# Patient Record
Sex: Female | Born: 2000 | Race: Black or African American | Hispanic: No | Marital: Single | Smoking: Former smoker
Health system: Southern US, Community
[De-identification: ages and names within clinical notes are randomized; demographics above are authoritative.]

## PROBLEM LIST (undated history)

## (undated) DIAGNOSIS — L309 Dermatitis, unspecified: Secondary | ICD-10-CM

## (undated) DIAGNOSIS — J45909 Unspecified asthma, uncomplicated: Secondary | ICD-10-CM

## (undated) HISTORY — PX: NO PAST SURGERIES: SHX2092

## (undated) HISTORY — DX: Unspecified asthma, uncomplicated: J45.909

## (undated) HISTORY — DX: Dermatitis, unspecified: L30.9

---
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
## 2020-01-02 ENCOUNTER — Ambulatory Visit (INDEPENDENT_AMBULATORY_CARE_PROVIDER_SITE_OTHER): Payer: BC Managed Care – PPO

## 2020-01-02 DIAGNOSIS — J309 Allergic rhinitis, unspecified: Secondary | ICD-10-CM | POA: Diagnosis not present

## 2020-01-20 ENCOUNTER — Ambulatory Visit (INDEPENDENT_AMBULATORY_CARE_PROVIDER_SITE_OTHER): Payer: BC Managed Care – PPO | Admitting: *Deleted

## 2020-01-20 ENCOUNTER — Telehealth: Payer: Self-pay | Admitting: *Deleted

## 2020-01-20 DIAGNOSIS — J309 Allergic rhinitis, unspecified: Secondary | ICD-10-CM

## 2020-01-20 NOTE — Telephone Encounter (Signed)
Patient will be moving to [facility] to attend [employer] this fall. She inquired about transferring her vials to receive her injections in [place]. I gave her the forms to have completed to bring back in order for [place] to send her vials out, I instructed her to check with the student health center at [employer] to see if they will administer her injections. Patient verbalized understanding and will look into this and will bring form back signed if they are able, she said if not then she will just continue to get her injections at our office.

## 2020-01-23 NOTE — Telephone Encounter (Signed)
Forms have been signed and faxed back. Called to inform patient however mail box has not been set up yet. Will need to try again tomorrow 01/24/2020 see when she would like to pick them up.

## 2020-01-23 NOTE — Telephone Encounter (Signed)
Forms are in the blue folder.

## 2020-01-25 NOTE — Progress Notes (Unsigned)
Immunotherapy   Patient Details  Name: Jasmine Aguilar MRN: [id number] Date of Birth: 04-16-2001  01/25/2020  Sharen Hones Gatti is transferring vials to the student health center at [employer] for them to administer her allergy injections while she is in school. One vial with Pollen-G-W-T and the other with M-DM-C-D-CR with an expiration of 11/27/2020. Patient's mother came to pick vials up since patient is already in school and unable to make it to get them. Allergy injection guidelines sheet as well as the treatment record sheet has been filled out and sent with vials. Following schedule:  A Frequency: Once weekly Epi-Pen: Yes Consent signed and patient instructions given.   Dub Mikes 01/25/2020, 10:39 AM

## 2020-01-26 NOTE — Telephone Encounter (Signed)
Mom picked up vials on 01/25/2020 to take to patient since she is already in school and unable to pick them up.

## 2020-02-15 ENCOUNTER — Telehealth: Payer: Self-pay

## 2020-02-15 NOTE — Telephone Encounter (Signed)
RN for [facility] stated patient is 4 weeks behind her scheduled appt shots and need confirmation on what to do next. Patient is to be backed down to 0.1 of her blue vial and continue schedule dose per Dr. Dellis Anes. RN Azucena Fallen verbalized understanding.

## 2020-02-15 NOTE — Telephone Encounter (Signed)
Agree with the plan.  Valera Vallas, MD [facility]  

## 2020-06-12 ENCOUNTER — Telehealth: Payer: Self-pay

## 2020-06-12 NOTE — Telephone Encounter (Signed)
Patient receives injections at school. Her last known injection was 03/21/2020. Unsure of dose or vial at this time. She is going to call school then call us back. I did make sure that she knew the protocol for our injections and to make sure the school is recording her injections on the correct paperwork. Patient verbalized understanding and will call back with needed information to be sent to MD for dosage instructions.

## 2020-06-14 NOTE — Telephone Encounter (Signed)
Spoke with mom and she is going to have Aashritha give [place] a call back. I did call the patient but her voicemail is not set up yet.

## 2020-06-15 NOTE — Telephone Encounter (Signed)
When patient calls back she does need to schedule a follow up appointment and bring her injection log records along with her vials.

## 2020-06-15 NOTE — Telephone Encounter (Signed)
Patient called back and was informed again of the needed information. She is supposed to call the school and get what is needed.

## 2020-06-18 NOTE — Telephone Encounter (Signed)
Received fax from [employer] [facility] [employer] [facility] with patient's shot records. Her last injection was 05/01/2020 at 0.44mL of her Blue vial. I attempted to call the [facility] but since it is the new semester their phones were busy and I was on hold for almost 10 minutes. I re-faxed the records to their Center and advised that they should re-start the patient on her Blue vial at 0.58mL and continue to build up on schedule A. Records have been labeled and placed in bulk scanning.

## 2020-06-19 IMAGING — US ULTRASOUND RIGHT BREAST LIMITED
1 series · 13 of 17 positions shown · non-contrast
Comparison: Previous exam(s).

CLINICAL DATA: Follow-up probable fibroadenomas in the right
breast. The patient started birth control pills in June 2017
preceded by Nexplanon.

EXAM:
ULTRASOUND OF THE RIGHT BREAST

[Series 1: ultrasound right breast limited · 0.07mm/px · 13 of 17 slices shown]
[im 1/17]
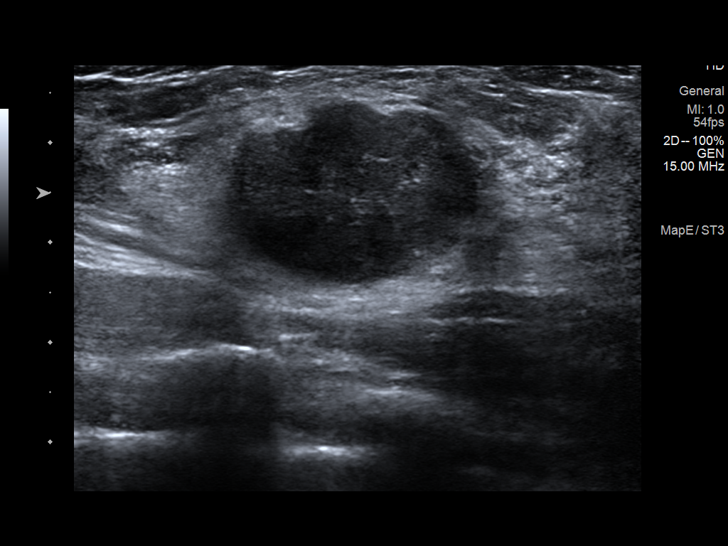
[im 2/17]
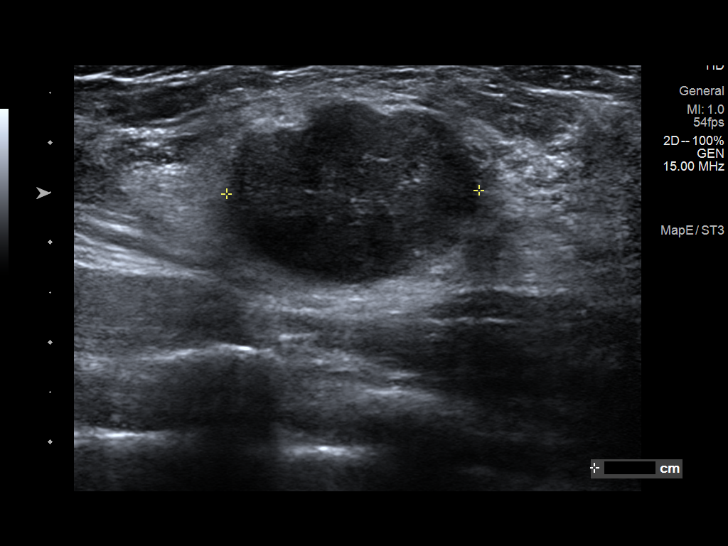
[im 4/17]
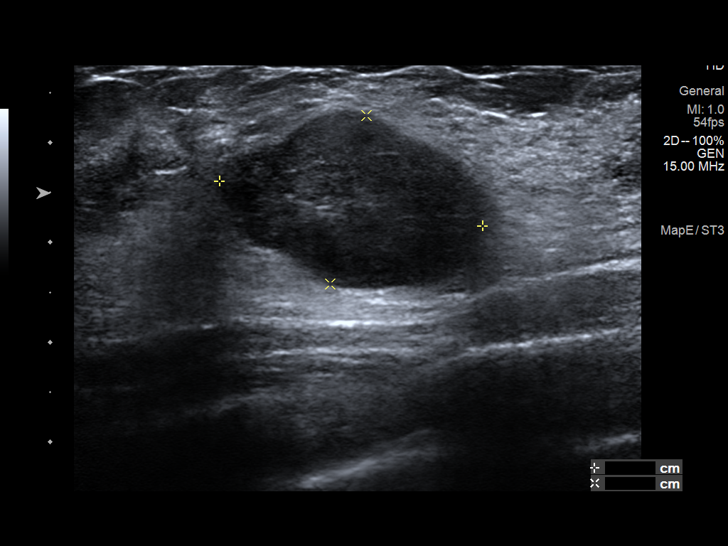
[im 5/17]
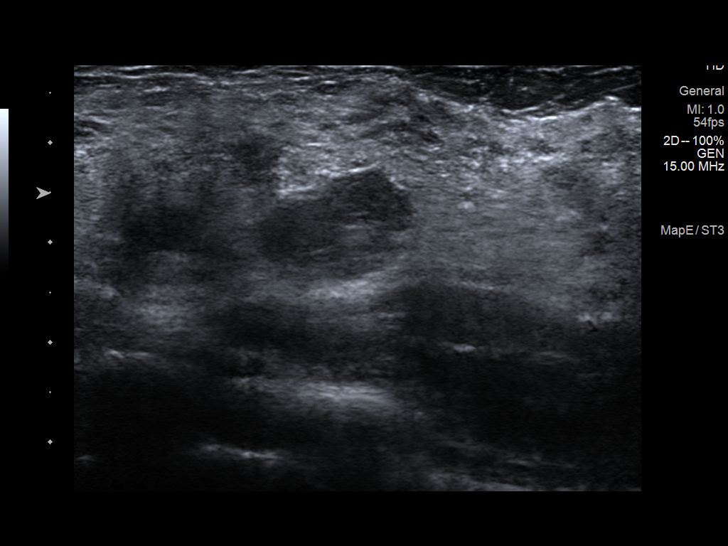
[im 6/17]
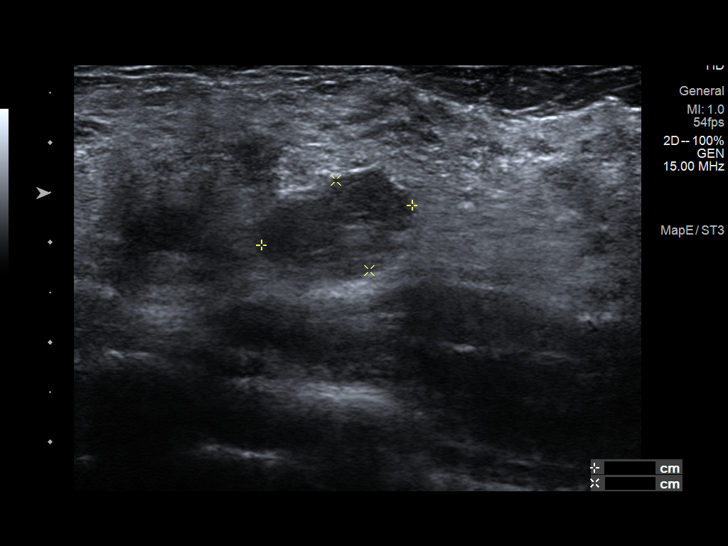
[im 8/17]
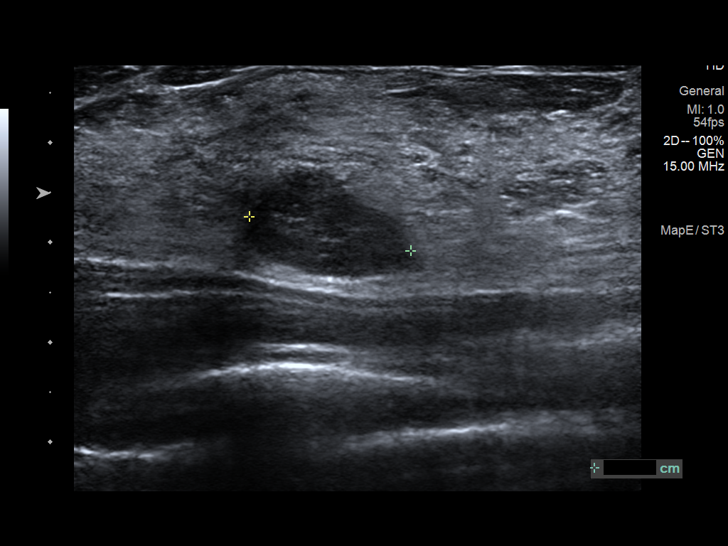
[im 9/17]
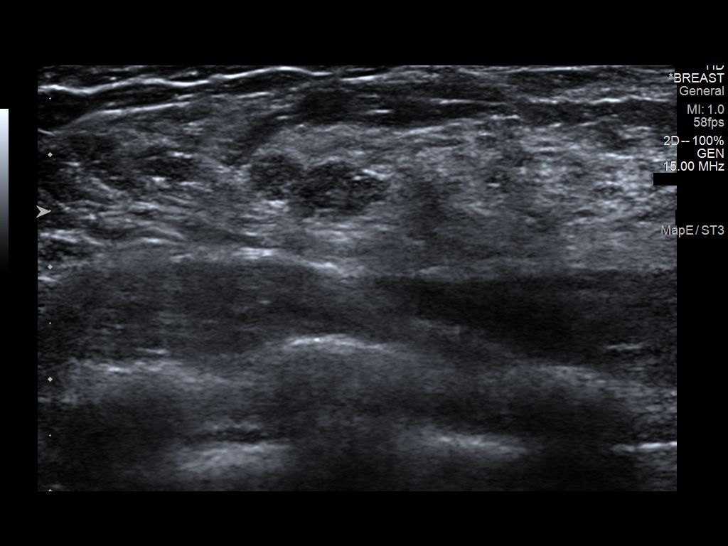
[im 10/17]
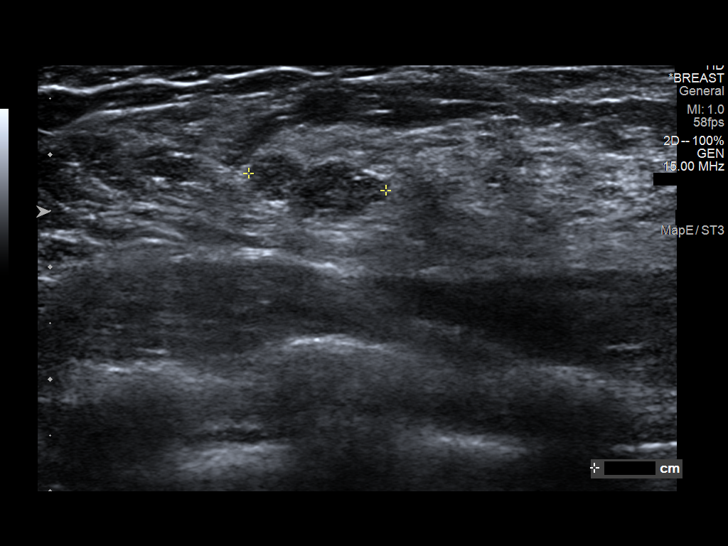
[im 12/17]
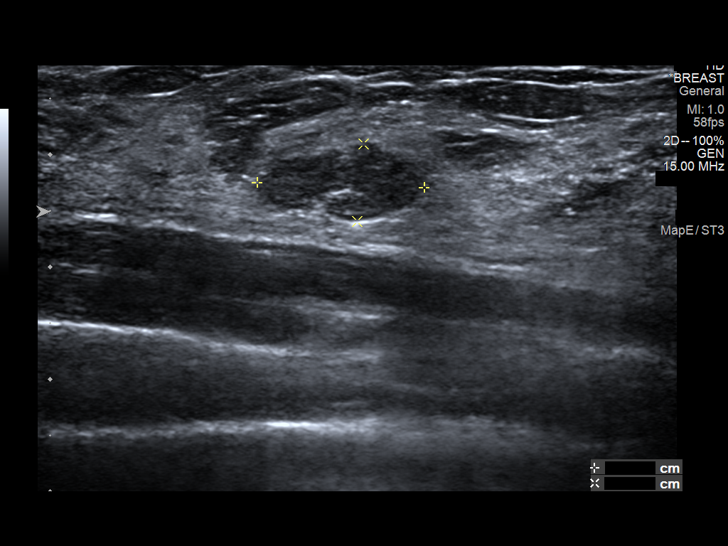
[im 13/17]
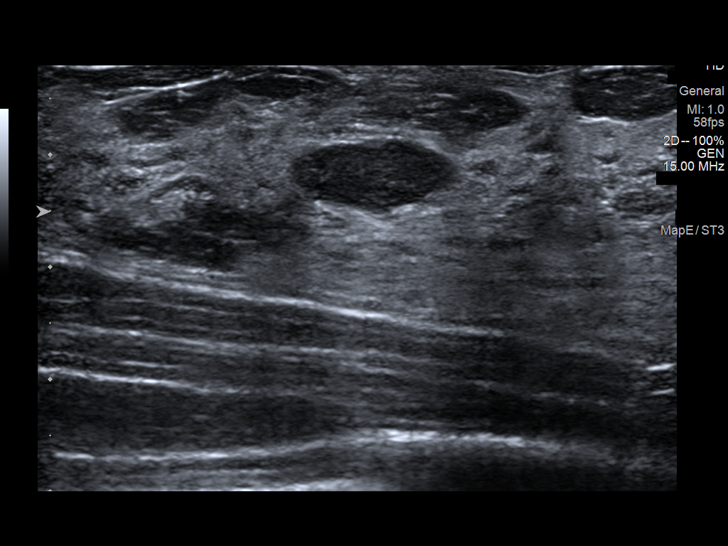
[im 14/17]
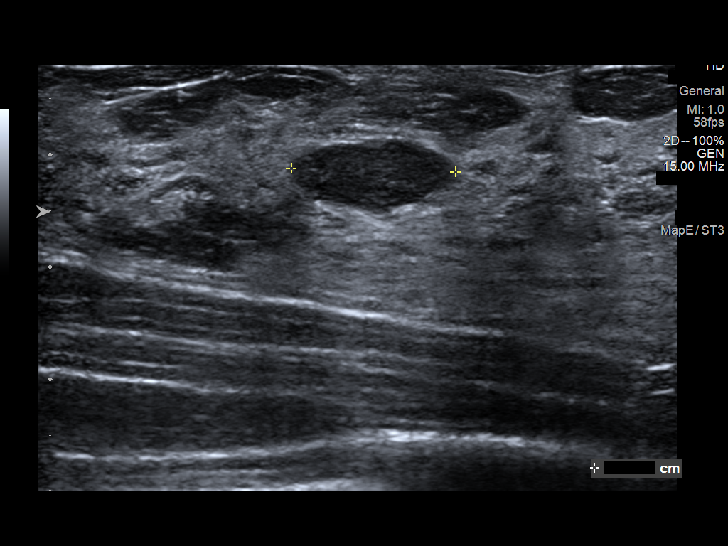
[im 16/17]
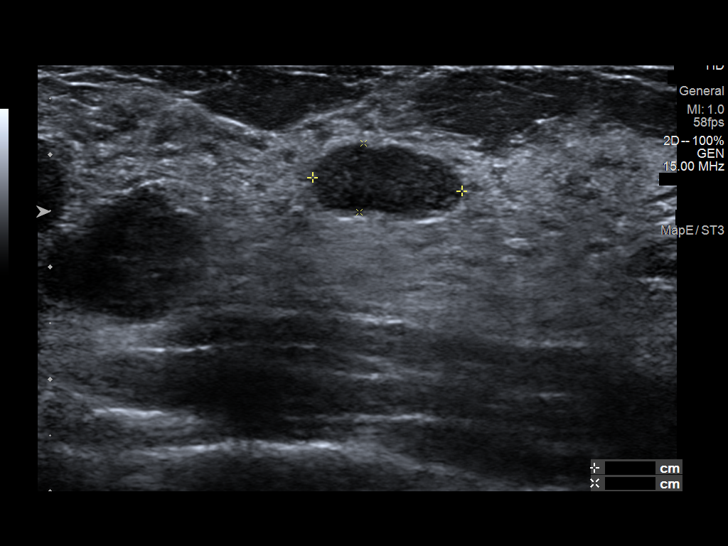
[im 17/17]
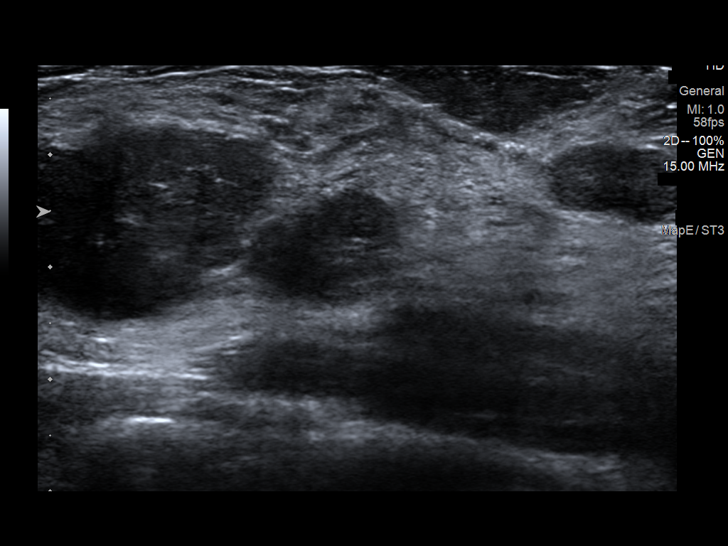

[13 of 17 positions shown; findings below may reference images not displayed]

FINDINGS: Targeted ultrasound is performed, showing four oval, circumscribed,
horizontally oriented, hypoechoic masses in the right breast as
follows:

2.7 x 2.5 x 1.7 cm in the 10 o'clock position, 6 cm from the nipple.
This measured 2.7 x 2.4 x 1.7 cm on 07/03/2017 and 2.7 x 2.4 x 1 5
cm on 12/31/2016.

1.7 x 1.6 x 1.0 cm in the 10:30 o'clock position, 6 cm from the
nipple. This measured 1.2 x 1.1 x 0.8 cm on 07/03/2017 and 1.0 x
x 0.6 cm on 12/31/2016.

1.5 x 1.2 x 0.7 cm in the 10:30 o'clock position, 6 cm from the
nipple. This measured 1.4 x 1.2 x 0.6 cm on 07/03/2017 and 1.3 x
x 0.5 cm on 12/31/2016.

1.5 x 1.3 x 0.6 cm in the 11 o'clock position, 6 cm from the nipple.
This measured 1.3 x 1.2 x 0.7 cm 07/03/2017, not seen on 12/31/2016.
IMPRESSION: Mild increase in size of the probable fibroadenoma in the 10:30
o'clock position of the right breast, 6 cm from the nipple. The
other 3 probable fibroadenomas have not changed significantly.

RECOMMENDATION:
Right breast ultrasound in 6 months.

I have discussed the findings and recommendations with the patient.
Results were also provided in writing at the conclusion of the
visit. If applicable, a reminder letter will be sent to the patient
regarding the next appointment.

BI-RADS CATEGORY  3: Probably benign.

## 2020-06-22 ENCOUNTER — Telehealth: Payer: Self-pay | Admitting: *Deleted

## 2020-06-22 NOTE — Telephone Encounter (Signed)
Patient called and stated that the [facility] didn't have her shot record of her last injection and did not receive the fax that I sent and that she needed to see the doctor to see what injection she needed next. I called and spoke with the nurse at the [facility] and she stated that the fax I sent over got cut off. She did have the most recent shot record and the noted last time that the patient received her injection. I advised to restart her Blue allergy vial at 0.67mL and to continue building up the patient on Schedule A. The nurse verbalized understanding and was fine with this. I called back the patient and advised. Patient verbalized understanding.

## 2020-07-18 NOTE — Telephone Encounter (Signed)
[facility] nurse [facility] called stating patient last received injection on 07/03/20. Advise nurse that our protocol states that we have to repeat last dose which is Blue allergy vial at 0.05 mL. Nurse Penn verbalized understanding.

## 2020-10-22 ENCOUNTER — Ambulatory Visit (INDEPENDENT_AMBULATORY_CARE_PROVIDER_SITE_OTHER): Payer: BC Managed Care – PPO | Admitting: *Deleted

## 2020-10-22 ENCOUNTER — Telehealth: Payer: Self-pay | Admitting: *Deleted

## 2020-10-22 DIAGNOSIS — J309 Allergic rhinitis, unspecified: Secondary | ICD-10-CM

## 2020-10-22 NOTE — Telephone Encounter (Signed)
Patient called and stated that she was picking up her allergy vials from her [employer] and asked if there were any forms she needed to fill out. I advised that she just needs to pick up her allergy vials and all of her injection records and bring them back to [place]. Patient verbalized understanding.

## 2020-10-23 DIAGNOSIS — J3089 Other allergic rhinitis: Secondary | ICD-10-CM | POA: Diagnosis not present

## 2020-10-23 NOTE — Progress Notes (Signed)
VIALS EXP 10-23-21 

## 2020-10-24 DIAGNOSIS — J301 Allergic rhinitis due to pollen: Secondary | ICD-10-CM | POA: Diagnosis not present

## 2020-10-26 ENCOUNTER — Ambulatory Visit: Payer: BC Managed Care – PPO | Admitting: Allergy

## 2020-10-26 ENCOUNTER — Other Ambulatory Visit: Payer: Self-pay

## 2020-10-26 ENCOUNTER — Encounter: Payer: Self-pay | Admitting: Allergy

## 2020-10-26 VITALS — BP 126/72 | HR 64 | Resp 16 | Ht 67.0 in | Wt 143.2 lb

## 2020-10-26 DIAGNOSIS — H1013 Acute atopic conjunctivitis, bilateral: Secondary | ICD-10-CM

## 2020-10-26 DIAGNOSIS — T781XXD Other adverse food reactions, not elsewhere classified, subsequent encounter: Secondary | ICD-10-CM

## 2020-10-26 DIAGNOSIS — J3089 Other allergic rhinitis: Secondary | ICD-10-CM | POA: Diagnosis not present

## 2020-10-26 DIAGNOSIS — J4520 Mild intermittent asthma, uncomplicated: Secondary | ICD-10-CM | POA: Diagnosis not present

## 2020-10-26 NOTE — Patient Instructions (Signed)
Mild intermittent asthma -- Well-controlled at this time -Inhalers:  Rescue: ProAir HFA puffs every 4 hours as needed for cough or wheeze.       -Use 2 puffs 10-20 minutes prior to exercise.  Asthma control goals:   Full participation in all desired activities (may need albuterol before activity)  Albuterol use two time or less a week on average (not counting use with activity)  Cough interfering with sleep two time or less a month  Oral steroids no more than once a year  No hospitalizations  Allergic rhinoconjunctivitis - Avoidance: Mite, Mold and Pollen - Antihistamine: Zyrtec 10mg  by mouth once daily for allergy symptom control   - Nasal Spray: Saline 2 spray(s) each nostril at bath time to keep nose/sinuses flushed and clean    If needed for nasal congestion Flonase 1-2 sprays each nostril daily.  Use 1-2 weeks at a time before stopping once symptoms improve.   - Continue allergen immunotherapy per schedule and have access to EpiPen.    Pollen food allergy syndrome  - Oral symptoms with banana injection in the past but is able to eat now but does not like to eat bananas in general    Follow up Visit: 12 months or sooner if needed.

## 2020-10-26 NOTE — Progress Notes (Signed)
Follow-up Note 0 RE: Jasmine Aguilar MRN: [id number] DOB: Oct 11, 2000 Date of Office Visit: 10/26/2020   History of present illness: Jasmine Aguilar is a 20 y.o. female presenting today for follow-up of asthma, allergic rhinitis with conjunctivitis with pollen food allergy syndrome.  She was last seen in the office on 11/24/2019 by myself.  She has done well since her last visit without any major health changes, surgeries or hospitalizations.  With her asthma she states she has not needed to use her albuterol since the last visit.  She denies any daytime or nighttime symptoms.  She has not had any ED or urgent care visits with systemic steroid needs. With her allergies she does have occasional nasal congestion and nasal itch and does use cetirizine daily.  This helps.  She states she will use her nasal spray Flonase as needed for the congestion which does help when used.  She is on immunotherapy in the buildup phase currently.  She is tolerating these well without any large local or systemic reactions.  She has been receiving her injections during the school year at her student health at [employer].  She does not really eat bananas.     Review of systems: Review of Systems  Constitutional: Negative.   HENT: Positive for congestion.   Eyes: Negative.   Respiratory: Negative.   Cardiovascular: Negative.   Gastrointestinal: Negative.   Musculoskeletal: Negative.   Skin: Negative.   Neurological: Negative.     All other systems negative unless noted above in HPI  Past medical/social/surgical/family history have been reviewed and are unchanged unless specifically indicated below.  Rising sophomore in college  Medication List: Current Outpatient Medications  Medication Sig Dispense Refill  . cetirizine (ZYRTEC) 10 MG tablet Take 1 tablet (10 mg total) by mouth daily. 30 tablet 5  . EPINEPHrine (EPIPEN 2-PAK) 0.3 mg/0.3 mL IJ SOAJ injection Inject 0.3 mLs (0.3 mg total) into  the muscle as needed for anaphylaxis. 2 each 1  . famotidine (PEPCID) 20 MG tablet Take 1 tablet (20 mg total) by mouth daily. 30 tablet 3  . fluticasone (FLONASE) 50 MCG/ACT nasal spray Place 2 sprays into both nostrils daily. 48 g 1  . PROAIR HFA 108 (90 Base) MCG/ACT inhaler INHALE 2 PUFFS INTO LUNGS EVERY 4 HOURS AS NEEDED FOR WHEEZING OR SHORTNESS OF BREATH 8.5 g 1  . norethindrone-ethinyl estradiol (LOESTRIN) 1-20 MG-MCG tablet TK 1 T PO QD (Patient not taking: No sig reported)     No current facility-administered medications for this visit.     Known medication allergies: No Known Allergies   Physical examination: Blood pressure 126/72, pulse 64, resp. rate 16, height 5\' 7"  (1.702 m), weight 143 lb 3.2 oz (65 kg), SpO2 100 %.  General: Alert, interactive, in no acute distress. HEENT: PERRLA, TMs pearly gray, turbinates mildly edematous without discharge, post-pharynx non erythematous. Neck: Supple without lymphadenopathy. Lungs: Clear to auscultation without wheezing, rhonchi or rales. {no increased work of breathing. CV: Normal S1, S2 without murmurs. Abdomen: Nondistended, nontender. Skin: Warm and dry, without lesions or rashes. Extremities:  No clubbing, cyanosis or edema. Neuro:   Grossly intact.  Diagnositics/Labs: None today  Assessment and plan:   Mild intermittent asthma -- Well-controlled at this time -Inhalers:  Rescue: ProAir HFA puffs every 4 hours as needed for cough or wheeze.       -Use 2 puffs 10-20 minutes prior to exercise.  Asthma control goals:   Full participation in all desired  activities (may need albuterol before activity)  Albuterol use two time or less a week on average (not counting use with activity)  Cough interfering with sleep two time or less a month  Oral steroids no more than once a year  No hospitalizations  Allergic rhinitis with conjunctivitis - Avoidance: Mite, Mold and Pollen - Antihistamine: Zyrtec 10mg  by mouth once  daily for allergy symptom control   - Nasal Spray: Saline 2 spray(s) each nostril at bath time to keep nose/sinuses flushed and clean    If needed for nasal congestion Flonase 1-2 sprays each nostril daily.  Use 1-2 weeks at a time before stopping once symptoms improve.   - Continue allergen immunotherapy per schedule and have access to EpiPen.    Pollen food allergy syndrome  - Oral symptoms with banana injection in the past but is able to eat now but does not like to eat bananas in general    Follow up Visit: 12 months or sooner if needed.    I appreciate the opportunity to take part in Omie's care. Please do not hesitate to contact me with questions.  Sincerely,   , MD Allergy/Immunology [facility]

## 2020-10-30 ENCOUNTER — Ambulatory Visit (INDEPENDENT_AMBULATORY_CARE_PROVIDER_SITE_OTHER): Payer: BC Managed Care – PPO | Admitting: *Deleted

## 2020-10-30 DIAGNOSIS — J309 Allergic rhinitis, unspecified: Secondary | ICD-10-CM | POA: Diagnosis not present

## 2020-11-27 ENCOUNTER — Ambulatory Visit (INDEPENDENT_AMBULATORY_CARE_PROVIDER_SITE_OTHER): Payer: BC Managed Care – PPO | Admitting: *Deleted

## 2020-11-27 DIAGNOSIS — J309 Allergic rhinitis, unspecified: Secondary | ICD-10-CM

## 2020-12-03 ENCOUNTER — Ambulatory Visit (INDEPENDENT_AMBULATORY_CARE_PROVIDER_SITE_OTHER): Payer: BC Managed Care – PPO

## 2020-12-03 DIAGNOSIS — J309 Allergic rhinitis, unspecified: Secondary | ICD-10-CM

## 2020-12-06 ENCOUNTER — Emergency Department (HOSPITAL_COMMUNITY)
Admission: EM | Admit: 2020-12-06 | Discharge: 2020-12-06 | Disposition: A | Discharge status: Home / Self Care | Payer: BC Managed Care – PPO | Attending: Emergency Medicine | Admitting: Emergency Medicine

## 2020-12-06 ENCOUNTER — Other Ambulatory Visit: Payer: Self-pay

## 2020-12-06 DIAGNOSIS — F1910 Other psychoactive substance abuse, uncomplicated: Secondary | ICD-10-CM | POA: Insufficient documentation

## 2020-12-06 DIAGNOSIS — F1990 Other psychoactive substance use, unspecified, uncomplicated: Secondary | ICD-10-CM

## 2020-12-06 DIAGNOSIS — R1084 Generalized abdominal pain: Secondary | ICD-10-CM

## 2020-12-06 DIAGNOSIS — R110 Nausea: Secondary | ICD-10-CM | POA: Diagnosis not present

## 2020-12-06 DIAGNOSIS — J45909 Unspecified asthma, uncomplicated: Secondary | ICD-10-CM | POA: Insufficient documentation

## 2020-12-06 LAB — I-STAT CHEM 8, ED
BUN: 12 mg/dL (ref 6–20)
Calcium, Ion: 1.28 mmol/L (ref 1.15–1.40)
Chloride: 99 mmol/L (ref 98–111)
Creatinine, Ser: 0.9 mg/dL (ref 0.44–1.00)
Glucose, Bld: 101 mg/dL — ABNORMAL HIGH (ref 70–99)
HCT: 42 % (ref 36.0–46.0)
Hemoglobin: 14.3 g/dL (ref 12.0–15.0)
Potassium: 3.8 mmol/L (ref 3.5–5.1)
Sodium: 137 mmol/L (ref 135–145)
TCO2: 28 mmol/L (ref 22–32)

## 2020-12-06 LAB — I-STAT BETA HCG BLOOD, ED (MC, WL, AP ONLY): I-stat hCG, quantitative: <5 m[IU]/mL (ref <5)

## 2020-12-06 MED ORDER — FAMOTIDINE IN NACL 20-0.9 MG/50ML-% IV SOLN
20.0000 mg | Freq: Once | INTRAVENOUS | Status: AC
  Administered 2020-12-06: 20 mg via INTRAVENOUS
  Filled 2020-12-06: qty 50

## 2020-12-06 MED ORDER — ONDANSETRON HCL 4 MG/2ML IJ SOLN
4.0000 mg | Freq: Once | INTRAMUSCULAR | Status: AC
  Administered 2020-12-06: 4 mg via INTRAVENOUS
  Filled 2020-12-06: qty 2

## 2020-12-06 MED ORDER — DICYCLOMINE HCL 10 MG/ML IM SOLN
20.0000 mg | Freq: Once | INTRAMUSCULAR | Status: DC
  Filled 2020-12-06: qty 2

## 2020-12-06 MED ORDER — SODIUM CHLORIDE 0.9 % IV BOLUS
1000.0000 mL | Freq: Once | INTRAVENOUS | Status: AC
  Administered 2020-12-06: 1000 mL via INTRAVENOUS

## 2020-12-06 NOTE — Discharge Instructions (Addendum)
Your symptoms were likely due to the drugs used earlier today, they can commonly cause anxiety or stomach upset.  Avoid using these in the future.  Follow-up with your primary care doctor as needed.

## 2020-12-06 NOTE — ED Triage Notes (Signed)
Pt BIB EMS from home. At approx 1400 today pt ate mushrooms and smoked marijuana, started feeling poorly afterwards. Pt A&Ox4, pt ambulatory with EMS upon arrival.   116/82 90 HR 99% RA

## 2020-12-06 NOTE — ED Notes (Signed)
An After Visit Summary was printed and given to the patient. Discharge instructions given and no further questions at this time.  Pt A&Ox4. Pt ambulatory.

## 2020-12-06 NOTE — ED Provider Notes (Signed)
[facility]-EMERGENCY DEPT Provider Note   CSN: [id number] Arrival date & time: 12/06/20  1813     History Chief Complaint  Patient presents with   Ingestion    Jasmine Aguilar is a 20 y.o. female.  Jasmine Aguilar is a 20 y.o. female with a history of asthma and eczema, who presents to the ED via EMS for evaluation of feeling poorly and some generalized abdominal pain.  Patient reports around 2:00 this afternoon she ate mushrooms and smoked marijuana later in the afternoon did additional mushrooms and started feeling very poorly.  She reports "I just do not feel right in my body".  She reports generalized abdominal pain and some nausea but no vomiting.  No chest pain or shortness of breath.  Patient also reports feeling anxious.  Patient reports she does have prior history of some GI issues and is followed by [facility], but has recently been doing better with this.  Patient very concerned and wondering what could be causing this abdominal pain.  No other aggravating or alleviating factors.  The history is provided by the patient.      Past Medical History:  Diagnosis Date   Asthma    Eczema     There are no problems to display for this patient.   Past Surgical History:  Procedure Laterality Date   NO PAST SURGERIES       OB History   No obstetric history on file.     Family History  Problem Relation Age of Onset   Bronchitis Father    Eczema Brother    Allergic rhinitis Neg Hx    Angioedema Neg Hx    Asthma Neg Hx    Atopy Neg Hx    Immunodeficiency Neg Hx    Urticaria Neg Hx     Social History   Tobacco Use   Smoking status: Never   Smokeless tobacco: Never  Vaping Use   Vaping Use: Some days  Substance Use Topics   Alcohol use: No    Alcohol/week: 0.0 standard drinks   Drug use: No    Home Medications Prior to Admission medications   Medication Sig Start Date End Date Taking? Authorizing Provider  cetirizine (ZYRTEC) 10 MG  tablet Take 1 tablet (10 mg total) by mouth daily. 11/24/19   Marcelyn Bruins, MD  EPINEPHrine (EPIPEN 2-PAK) 0.3 mg/0.3 mL IJ SOAJ injection Inject 0.3 mLs (0.3 mg total) into the muscle as needed for anaphylaxis. 11/24/19   Marcelyn Bruins, MD  famotidine (PEPCID) 20 MG tablet Take 1 tablet (20 mg total) by mouth daily. 11/24/19   Marcelyn Bruins, MD  fluticasone (FLONASE) 50 MCG/ACT nasal spray Place 2 sprays into both nostrils daily. 11/24/19   Marcelyn Bruins, MD  norethindrone-ethinyl estradiol (LOESTRIN) 1-20 MG-MCG tablet TK 1 T PO QD Patient not taking: No sig reported 08/15/18   [provider]  PROAIR HFA 108 (90 Base) MCG/ACT inhaler INHALE 2 PUFFS INTO LUNGS EVERY 4 HOURS AS NEEDED FOR WHEEZING OR SHORTNESS OF BREATH 11/24/18   Marcelyn Bruins, MD    Allergies    Patient has no known allergies.  Review of Systems   Review of Systems  Constitutional:  Negative for chills and fever.  Respiratory:  Negative for cough and shortness of breath.   Cardiovascular:  Negative for chest pain.  Gastrointestinal:  Positive for abdominal pain and nausea. Negative for constipation, diarrhea and vomiting.  Psychiatric/Behavioral:  The patient is  nervous/anxious.   All other systems reviewed and are negative.  Physical Exam Updated Vital Signs BP (!) 143/92 (BP Location: Left Arm)   Pulse 92   Temp 98.8 F (37.1 C) (Oral)   Resp 20   SpO2 99%   Physical Exam Vitals and nursing note reviewed.  Constitutional:      General: She is not in acute distress.    Appearance: Normal appearance. She is well-developed. She is not ill-appearing or diaphoretic.     Comments: Patient appears anxious, alert and in no acute distress.  HENT:     Head: Normocephalic and atraumatic.  Eyes:     General:        Right eye: No discharge.        Left eye: No discharge.  Cardiovascular:     Rate and Rhythm: Normal rate and regular rhythm.     Heart  sounds: Normal heart sounds.  Pulmonary:     Effort: Pulmonary effort is normal. No respiratory distress.  Abdominal:     General: Bowel sounds are normal. There is no distension.     Palpations: Abdomen is soft. There is no mass.     Tenderness: There is abdominal tenderness. There is no guarding.     Comments: Abdomen is soft, nondistended, bowel sounds present throughout, very mild epigastric tenderness, all other quadrants nontender.  Musculoskeletal:        General: No deformity.  Skin:    General: Skin is warm and dry.  Neurological:     Mental Status: She is alert and oriented to person, place, and time.     Coordination: Coordination normal.  Psychiatric:        Mood and Affect: Mood is anxious. Affect is labile.        Behavior: Behavior normal.    ED Results / Procedures / Treatments   Labs (all labs ordered are listed, but only abnormal results are displayed) Labs Reviewed  I-STAT CHEM 8, ED - Abnormal; Notable for the following components:      Result Value   Glucose, Bld 101 (*)    All other components within normal limits  I-STAT BETA HCG BLOOD, ED ([facility], [facility], [facility] ONLY)    EKG None  Radiology No results found.  Procedures Procedures   Medications Ordered in ED Medications  dicyclomine (BENTYL) injection 20 mg (20 mg Intramuscular Patient Refused/Not Given 12/06/20 1859)  sodium chloride 0.9 % bolus 1,000 mL (0 mLs Intravenous Stopped 12/06/20 1945)  ondansetron (ZOFRAN) injection 4 mg (4 mg Intravenous Given 12/06/20 1856)  famotidine (PEPCID) IVPB 20 mg premix (0 mg Intravenous Stopped 12/06/20 1945)    ED Course  I have reviewed the triage vital signs and the nursing notes.  Pertinent labs & imaging results that were available during my care of the patient were reviewed by me and considered in my medical decision making (see chart for details).    MDM Rules/Calculators/A&P                         20 year old female who is abdominal pain after using  mushrooms and marijuana earlier this afternoon.  On arrival patient is still high but is alert, and anxious.  Has very mild epigastric tenderness.  Will check i-STAT Chem-8 and pregnancy test and give IV fluids, Zofran and Pepcid.  Suspect symptoms are related to drug use, extremely low suspicion for more serious acute intra-abdominal pathology given reassuring exam.  Lab work is unremarkable.  Symptoms are improved after fluids and medications here in the ED and patient is feeling much better, asking to go home.   At this time there does not appear to be any evidence of an acute emergency medical condition and the patient appears stable for discharge with appropriate outpatient follow up.Diagnosis was discussed with patient who verbalizes understanding and is agreeable to discharge.  Final Clinical Impression(s) / ED Diagnoses Final diagnoses:  Generalized abdominal pain  Nausea  Drug use    Rx / DC Orders ED Discharge Orders     None        Legrand Rams 12/06/20 2015    Terrilee Files, MD 12/07/20 1428

## 2020-12-11 ENCOUNTER — Ambulatory Visit (INDEPENDENT_AMBULATORY_CARE_PROVIDER_SITE_OTHER): Payer: BC Managed Care – PPO | Admitting: *Deleted

## 2020-12-11 DIAGNOSIS — J309 Allergic rhinitis, unspecified: Secondary | ICD-10-CM

## 2020-12-17 ENCOUNTER — Ambulatory Visit (INDEPENDENT_AMBULATORY_CARE_PROVIDER_SITE_OTHER): Payer: BC Managed Care – PPO

## 2020-12-17 DIAGNOSIS — J309 Allergic rhinitis, unspecified: Secondary | ICD-10-CM | POA: Diagnosis not present

## 2020-12-31 ENCOUNTER — Ambulatory Visit (INDEPENDENT_AMBULATORY_CARE_PROVIDER_SITE_OTHER): Payer: BC Managed Care – PPO

## 2020-12-31 DIAGNOSIS — J309 Allergic rhinitis, unspecified: Secondary | ICD-10-CM

## 2021-01-14 ENCOUNTER — Ambulatory Visit (INDEPENDENT_AMBULATORY_CARE_PROVIDER_SITE_OTHER): Payer: BC Managed Care – PPO | Admitting: *Deleted

## 2021-01-14 DIAGNOSIS — J309 Allergic rhinitis, unspecified: Secondary | ICD-10-CM

## 2021-01-22 ENCOUNTER — Ambulatory Visit (INDEPENDENT_AMBULATORY_CARE_PROVIDER_SITE_OTHER): Payer: BC Managed Care – PPO | Admitting: *Deleted

## 2021-01-22 DIAGNOSIS — J309 Allergic rhinitis, unspecified: Secondary | ICD-10-CM

## 2021-03-14 ENCOUNTER — Telehealth: Payer: Self-pay | Admitting: *Deleted

## 2021-03-14 NOTE — Telephone Encounter (Signed)
Received fax from [facility] requesting patients Gold vials. She reached .40mL of her Blue Vials, I called and spoke with the injection nurse and she stated that there was plenty of serum left for her to reach .51mL of her Blue vials, but that they would be needing the Gold vials soon. I advised that we will mail them out this coming Monday to [facility]. [employer], [address], [address]. [address], [place], [place] [address]. Phone number [phone] Fax [phone]. Patient has been scheduled for her Gold vials to be mailed out this coming Monday. Called patient and informed and advised not to cancel the appointment, patient verbalized understanding. Shot records have been labeled and placed in bulk scanning.

## 2021-03-18 ENCOUNTER — Ambulatory Visit: Payer: BC Managed Care – PPO

## 2021-03-18 ENCOUNTER — Other Ambulatory Visit: Payer: Self-pay

## 2021-03-18 DIAGNOSIS — J309 Allergic rhinitis, unspecified: Secondary | ICD-10-CM

## 2021-03-18 NOTE — Progress Notes (Signed)
Immunotherapy   Patient Details  Name: Jasmine Aguilar MRN: [id number] Date of Birth: 2000/06/26  03/18/2021  Sharen Hones Whittlesey mail out gold vials to [facility] [employer], [address], [address]. [address] [place], [place] [address]. Phone number [phone] Fax [phone] per telephone encounter. One gold vial with Pollen: G-W-T and the other with M-DM-C-D-CR with an expiration of 10/23/2021.  Following schedule: A  Frequency:1 time per week Epi-Pen:Epi-Pen Available  Consent signed and patient instructions given.   Dub Mikes 03/18/2021, 8:53 AM

## 2021-05-22 ENCOUNTER — Ambulatory Visit (INDEPENDENT_AMBULATORY_CARE_PROVIDER_SITE_OTHER): Payer: BC Managed Care – PPO

## 2021-05-22 DIAGNOSIS — J309 Allergic rhinitis, unspecified: Secondary | ICD-10-CM | POA: Diagnosis not present

## 2021-06-11 ENCOUNTER — Ambulatory Visit (INDEPENDENT_AMBULATORY_CARE_PROVIDER_SITE_OTHER): Payer: BC Managed Care – PPO | Admitting: *Deleted

## 2021-06-11 DIAGNOSIS — J309 Allergic rhinitis, unspecified: Secondary | ICD-10-CM

## 2021-06-21 ENCOUNTER — Ambulatory Visit (INDEPENDENT_AMBULATORY_CARE_PROVIDER_SITE_OTHER): Payer: BC Managed Care – PPO

## 2021-06-21 DIAGNOSIS — J309 Allergic rhinitis, unspecified: Secondary | ICD-10-CM | POA: Diagnosis not present

## 2021-06-21 NOTE — Progress Notes (Signed)
Immunotherapy   Patient Details  Name: Jasmine Aguilar MRN: [id number] Date of Birth: December 06, 2000  06/21/2021  [name] is here to receive injection and pick up vials to take to college for M-DM-C-D-CR and Pollen-G-W-T Following schedule: A  Frequency:1 time per week Epi-Pen:Epi-Pen Available  Paperwork provided and  patient instructions given.   Guy Franco 06/21/2021, 3:50 PM

## 2021-06-25 IMAGING — US US BREAST*R* LIMITED INC AXILLA
1 series · 13 of 16 positions shown · non-contrast
Comparison: Previous exam(s).

CLINICAL DATA: Follow-up of probably benign right breast masses,
first imaged in December 2016.

EXAM:
ULTRASOUND OF THE RIGHT BREAST

[Series 1: us breast*right* limited inc axilla · 0.07mm/px · 13 of 16 slices shown]
[im 1/16]
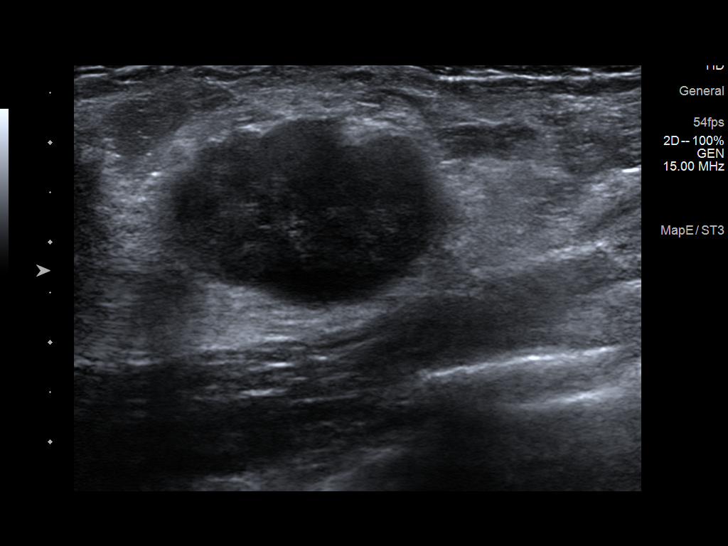
[im 2/16]
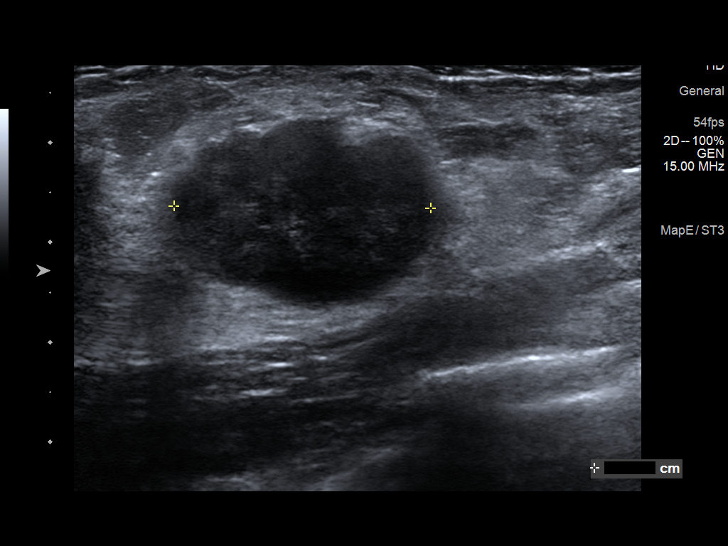
[im 4/16]
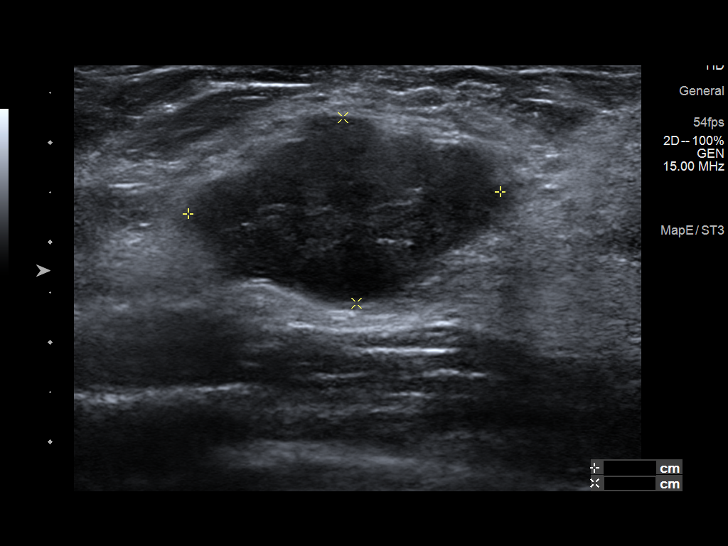
[im 5/16]
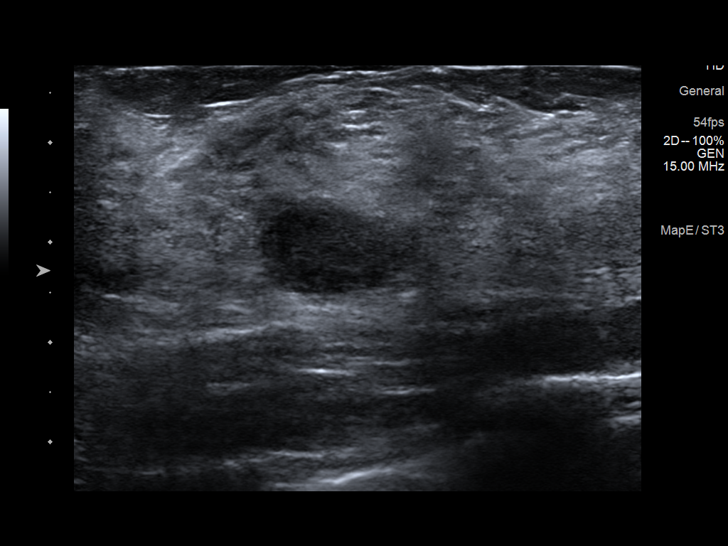
[im 6/16]
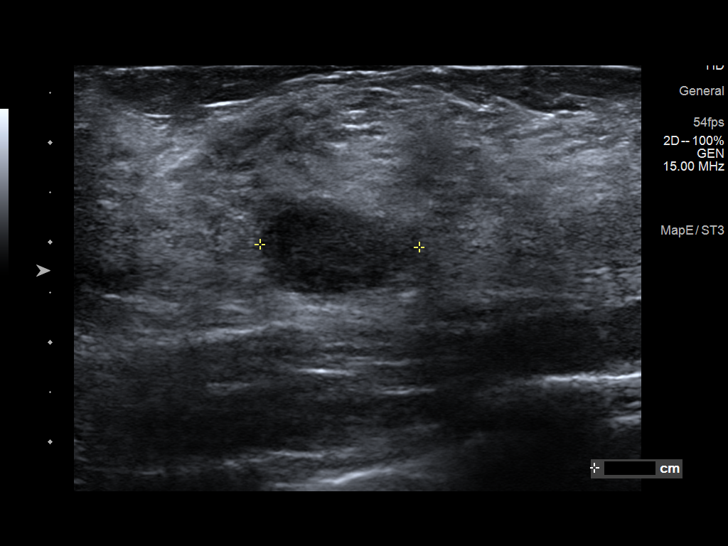
[im 7/16]
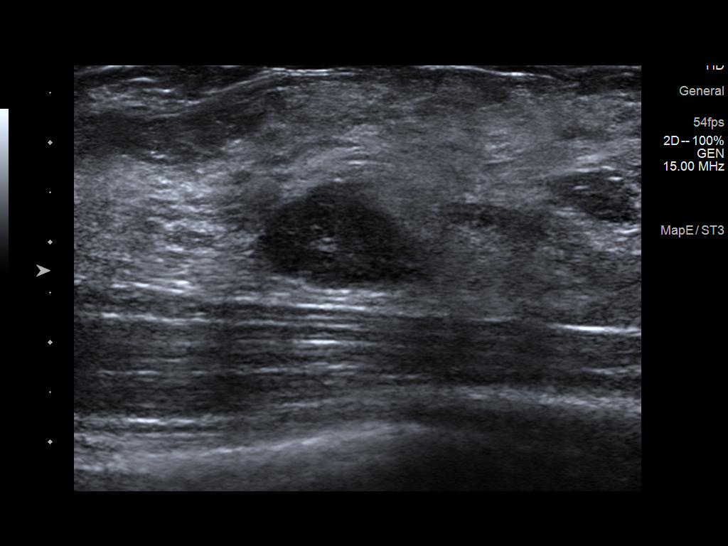
[im 9/16]
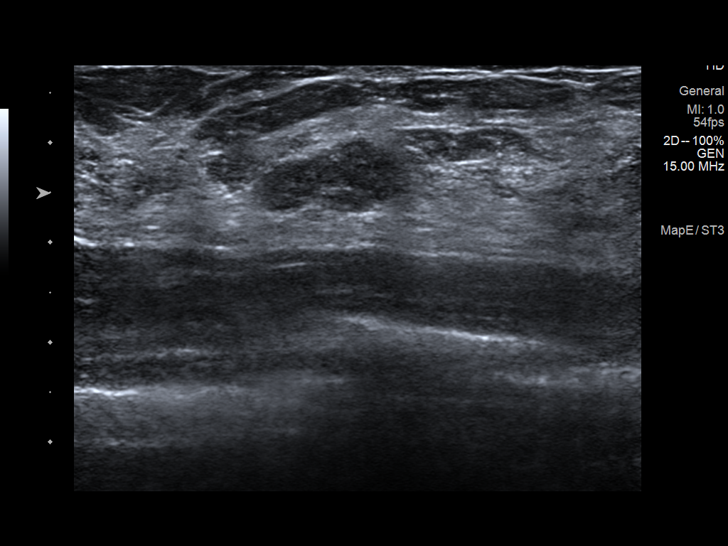
[im 10/16]
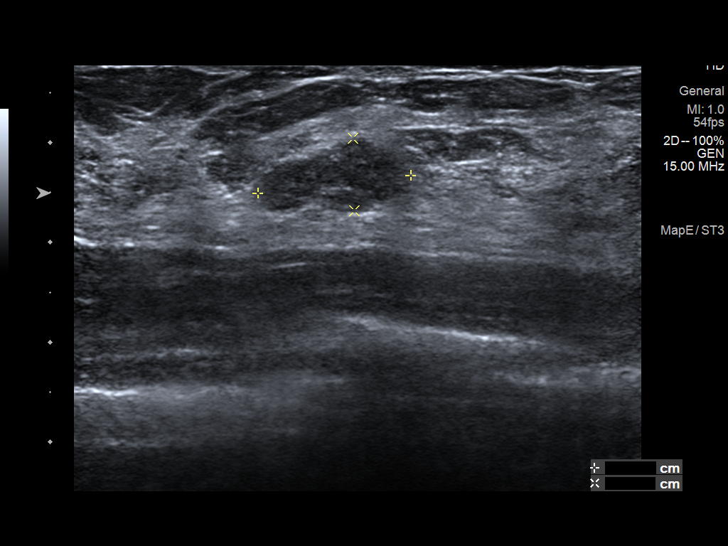
[im 11/16]
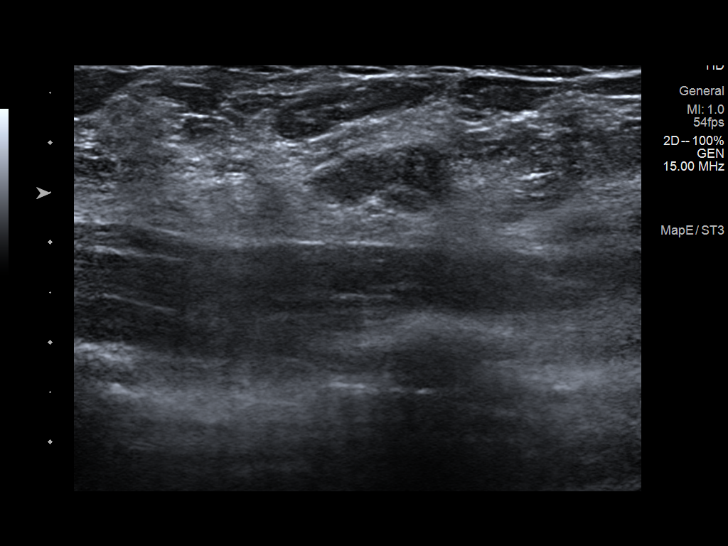
[im 12/16]
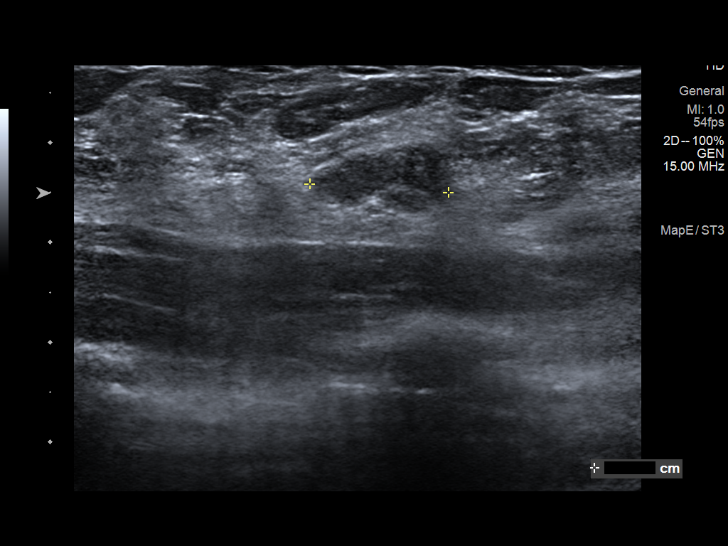
[im 13/16]
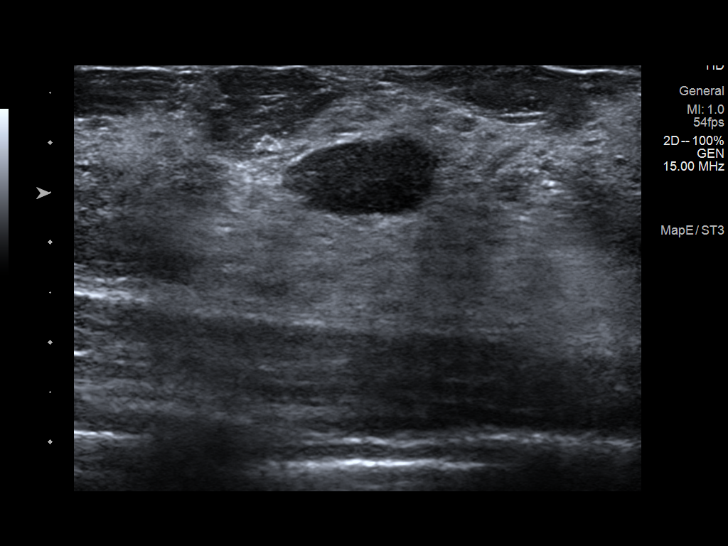
[im 15/16]
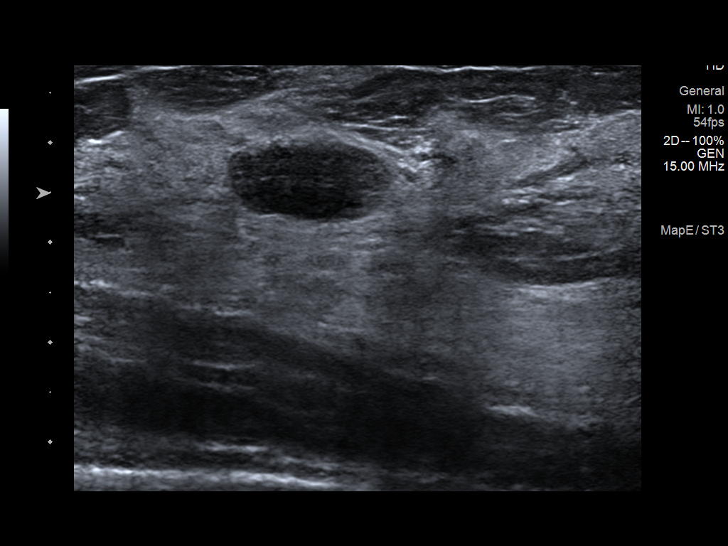
[im 16/16]
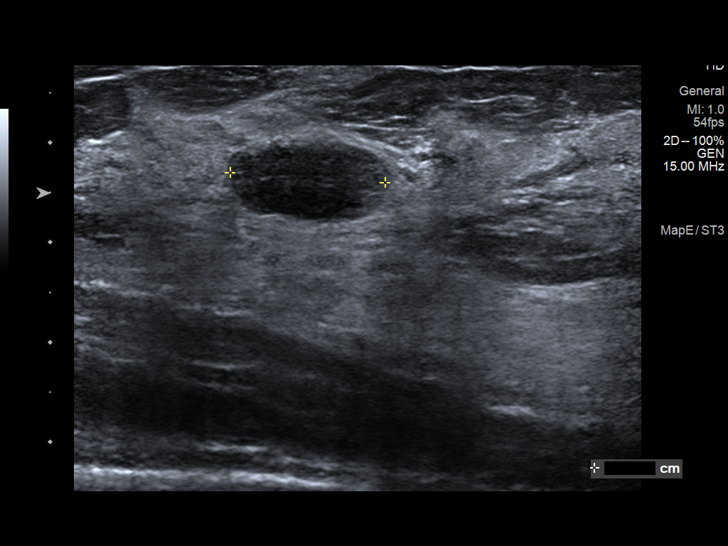

[13 of 16 positions shown; findings below may reference images not displayed]

FINDINGS: Targeted ultrasound is performed, showing stable accounting for
differences in measurement techniques hypoechoic circumscribed
benign-appearing right breast masses suggestive of fibroadenomas.
Such masses are seen in the right breast 10 o'clock 6 cm from the
nipple measuring 2.6 x 3.1 x 1.9 cm, right breast 10:30 o'clock 6 cm
from the nipple, measuring 1.6 x 1.7 x 1.1 cm, right breast 10:30
o'clock 6 cm from the nipple measuring 1.5 x 0.7 x 1.4 cm, and right
breast 11 o'clock 6 cm from the nipple measuring 1.5 x 0.8 x 1.6 cm.
IMPRESSION: Stable benign-appearing right breast masses.

RECOMMENDATION:
The patient has completed 2 years of imaging follow-up. Further
management of patient's right breast masses should be based on
clinical grounds. She was advised to return for ultrasound
examination should she or her doctor detect change in size on
physical exam.

Screening mammogram at age 40 unless there are persistent or
intervening clinical concerns. (Code:L5-7-BXC)

I have discussed the findings and recommendations with the patient.
Results were also provided in writing at the conclusion of the
visit. If applicable, a reminder letter will be sent to the patient
regarding the next appointment.

BI-RADS CATEGORY  2: Benign.

## 2021-09-03 ENCOUNTER — Ambulatory Visit (INDEPENDENT_AMBULATORY_CARE_PROVIDER_SITE_OTHER): Payer: BC Managed Care – PPO

## 2021-09-03 DIAGNOSIS — J309 Allergic rhinitis, unspecified: Secondary | ICD-10-CM | POA: Diagnosis not present

## 2021-09-03 NOTE — Progress Notes (Signed)
Immunotherapy ? ? ?Patient Details  ?Name: Jasmine Aguilar ?MRN: [id number] ?Date of Birth: [birth year] ? ?09/03/2021 ? ?Jasmine Aguilar is here to receive first injection and pick up Gold vials to take to college for M-DM-C-D-CR and Pollen-G-W-T ?Following schedule: A  ?Frequency:1 time per week ?Epi-Pen:Epi-Pen Available  ?Paperwork provided and  patient instructions given. ? ?Dub Mikes ?09/03/2021, 10:55 AM ? ? ?

## 2021-09-10 NOTE — Progress Notes (Signed)
VIALS EXP 09-11-22 ?

## 2021-09-11 DIAGNOSIS — J3081 Allergic rhinitis due to animal (cat) (dog) hair and dander: Secondary | ICD-10-CM | POA: Diagnosis not present

## 2021-10-10 ENCOUNTER — Ambulatory Visit (INDEPENDENT_AMBULATORY_CARE_PROVIDER_SITE_OTHER): Payer: BC Managed Care – PPO

## 2021-10-10 DIAGNOSIS — J309 Allergic rhinitis, unspecified: Secondary | ICD-10-CM | POA: Diagnosis not present

## 2021-10-23 ENCOUNTER — Ambulatory Visit (INDEPENDENT_AMBULATORY_CARE_PROVIDER_SITE_OTHER): Payer: BC Managed Care – PPO

## 2021-10-23 DIAGNOSIS — J309 Allergic rhinitis, unspecified: Secondary | ICD-10-CM

## 2021-10-28 ENCOUNTER — Ambulatory Visit (INDEPENDENT_AMBULATORY_CARE_PROVIDER_SITE_OTHER): Payer: BC Managed Care – PPO

## 2021-10-28 DIAGNOSIS — J309 Allergic rhinitis, unspecified: Secondary | ICD-10-CM

## 2021-11-01 ENCOUNTER — Ambulatory Visit (INDEPENDENT_AMBULATORY_CARE_PROVIDER_SITE_OTHER): Payer: BC Managed Care – PPO | Admitting: Allergy

## 2021-11-01 ENCOUNTER — Encounter: Payer: Self-pay | Admitting: Allergy

## 2021-11-01 VITALS — BP 124/70 | HR 96 | Temp 98.4°F | Resp 16 | Ht 66.73 in | Wt 142.0 lb

## 2021-11-01 DIAGNOSIS — T781XXD Other adverse food reactions, not elsewhere classified, subsequent encounter: Secondary | ICD-10-CM | POA: Diagnosis not present

## 2021-11-01 DIAGNOSIS — J3089 Other allergic rhinitis: Secondary | ICD-10-CM | POA: Diagnosis not present

## 2021-11-01 DIAGNOSIS — H1013 Acute atopic conjunctivitis, bilateral: Secondary | ICD-10-CM

## 2021-11-01 DIAGNOSIS — K219 Gastro-esophageal reflux disease without esophagitis: Secondary | ICD-10-CM

## 2021-11-01 DIAGNOSIS — J4520 Mild intermittent asthma, uncomplicated: Secondary | ICD-10-CM

## 2021-11-01 MED ORDER — EPINEPHRINE 0.3 MG/0.3ML IJ SOAJ: 0.3000 mg | INTRAMUSCULAR | 1 refills | Status: AC | PRN

## 2021-11-01 MED ORDER — PANTOPRAZOLE SODIUM 40 MG PO TBEC: 40.0000 mg | DELAYED_RELEASE_TABLET | Freq: Every day | ORAL | 11 refills | Status: DC

## 2021-11-01 NOTE — Patient Instructions (Addendum)
Mild intermittent asthma -- Well-controlled  -Inhalers: Rescue: ProAir HFA puffs every 4 hours as needed for cough or wheeze.       -Use 2 puffs 10-20 minutes prior to exercise.   Asthma control goals:  Full participation in all desired activities (may need albuterol before activity) Albuterol use two time or less a week on average (not counting use with activity) Cough interfering with sleep two time or less a month Oral steroids no more than once a year No hospitalizations   Allergic rhinoconjunctivitis - Avoidance: Mite, Mold and Pollen - Antihistamine: Zyrtec 10mg  by mouth once daily as needed for allergy symptom control.  Advised to take on days of her allergy injection. - Nasal Spray: Saline 2 spray(s) each nostril at bath time to keep nose/sinuses flushed and clean    If needed for nasal congestion Flonase 1-2 sprays each nostril daily.  Use 1-2 weeks at a time before stopping once symptoms improve.   - Continue allergen immunotherapy per schedule and have access to EpiPen.     Pollen food allergy syndrome  - Oral symptoms with banana injection in the past but is able to eat now without issue  Reflux -Continue Protonix 40 mg daily for control as you need   Follow up Visit: 12 months or sooner if needed.

## 2021-11-01 NOTE — Progress Notes (Signed)
Follow-up Note  RE: Jasmine Aguilar MRN: [id number] DOB: 2001-04-08 Date of Office Visit: 11/01/2021   History of present illness: Jasmine Aguilar is a 21 y.o. female presenting today for follow-up of allergic rhinoconjunctivitis, food allergy syndrome and asthma.  She was last seen in the office on 10/26/2020 by myself.  She has had a good year without any major health changes, surgeries or hospitalizations. She restarted allergy immunotherapy in 2021.  She is currently in the 1-10,000 vial.  She is coming weekly.  While she is at college in [place] she gets her shots at student health and during the summer she gets her shots here in our office in [place].  She denies any large local or systemic reactions with her shots.  She states she is noting improvement in her symptoms since being back on shots.  She states her nasal and sneezing symptoms are not nearly as bad and she is using Zyrtec as needed as well as Flonase. She does still tolerate banana in the diet when she does ingest. She has not had any issues with her asthma since the last visit.  She has not needed to use albuterol since the last visit.  She denies any daytime or nighttime symptoms and has not required any ED or urgent care visits or systemic steroid needs.  Review of systems the past 4 weeks: Review of Systems  Constitutional: Negative.   HENT: Negative.    Eyes: Negative.   Respiratory: Negative.    Cardiovascular: Negative.   Gastrointestinal: Negative.   Musculoskeletal: Negative.   Skin: Negative.   Allergic/Immunologic: Negative.   Neurological: Negative.     All other systems negative unless noted above in HPI  Past medical/social/surgical/family history have been reviewed and are unchanged unless specifically indicated below.  No changes  Medication List: Current Outpatient Medications  Medication Sig Dispense Refill   albuterol (VENTOLIN HFA) 108 (90 Base) MCG/ACT inhaler Inhale 2 puffs into  the lungs every 6 (six) hours as needed for wheezing or shortness of breath.     busPIRone (BUSPAR) 10 MG tablet Take 10 mg by mouth 2 (two) times daily.     cetirizine (ZYRTEC) 10 MG tablet Take 1 tablet (10 mg total) by mouth daily. 30 tablet 5   famotidine (PEPCID) 20 MG tablet Take 1 tablet (20 mg total) by mouth daily. 30 tablet 3   Ferrous Sulfate Dried (FERROUS SULFATE IRON PO) Take by mouth.     fluticasone (FLONASE) 50 MCG/ACT nasal spray Place 2 sprays into both nostrils daily. 48 g 1   lisdexamfetamine (VYVANSE) 30 MG capsule Take by mouth.     Multiple Vitamin tablet Take 1 tablet by mouth daily.     Vitamin D, Ergocalciferol, (DRISDOL) 1.25 MG (50000 UNIT) CAPS capsule Take 50,000 Units by mouth 2 (two) times a week.     EPINEPHrine (EPIPEN 2-PAK) 0.3 mg/0.3 mL IJ SOAJ injection Inject 0.3 mg into the muscle as needed for anaphylaxis. 2 each 1   norethindrone-ethinyl estradiol (LOESTRIN) 1-20 MG-MCG tablet TK 1 T PO QD (Patient not taking: No sig reported)     pantoprazole (PROTONIX) 40 MG tablet Take 1 tablet (40 mg total) by mouth daily. 30 tablet 11   No current facility-administered medications for this visit.     Known medication allergies: No Known Allergies   Physical examination: Blood pressure 124/70, pulse 96, temperature 98.4 F (36.9 C), temperature source Temporal, resp. rate 16, height 5' 6.73" (1.695 m), weight 142  lb (64.4 kg), SpO2 100 %.  General: Alert, interactive, in no acute distress. HEENT: PERRLA, TMs pearly gray, turbinates non-edematous without discharge, post-pharynx non erythematous. Neck: Supple without lymphadenopathy. Lungs: Clear to auscultation without wheezing, rhonchi or rales. {no increased work of breathing. CV: Normal S1, S2 without murmurs. Abdomen: Nondistended, nontender. Skin: Warm and dry, without lesions or rashes. Extremities:  No clubbing, cyanosis or edema. Neuro:   Grossly intact.  Diagnositics/Labs:  Spirometry: FEV1:  2.84 L 91%, FVC: 3.24L 92%, ratio consistent with nonobstructive pattern  Assessment and plan:   Mild intermittent asthma -- Well-controlled  -Inhalers: Rescue: ProAir HFA puffs every 4 hours as needed for cough or wheeze.       -Use 2 puffs 10-20 minutes prior to exercise.   Asthma control goals:  Full participation in all desired activities (may need albuterol before activity) Albuterol use two time or less a week on average (not counting use with activity) Cough interfering with sleep two time or less a month Oral steroids no more than once a year No hospitalizations   Allergic rhinoconjunctivitis - Avoidance: Mite, Mold and Pollen - Antihistamine: Zyrtec 10mg  by mouth once daily as needed for allergy symptom control.  Advised to take on days of her allergy injection. - Nasal Spray: Saline 2 spray(s) each nostril at bath time to keep nose/sinuses flushed and clean    If needed for nasal congestion Flonase 1-2 sprays each nostril daily.  Use 1-2 weeks at a time before stopping once symptoms improve.   - Continue allergen immunotherapy per schedule and have access to EpiPen.  She is noticing improvement in symptoms since being on immunotherapy.   Pollen food allergy syndrome  - Oral symptoms with banana injection in the past but is able to eat now without issue  Reflux -Continue Protonix 40 mg daily for control as you need   Follow up Visit: 12 months or sooner if needed.     I appreciate the opportunity to take part in Camala's care. Please do not hesitate to contact me with questions.  Sincerely,   , MD Allergy/Immunology [facility]

## 2021-11-01 NOTE — Addendum Note (Signed)
Addended by: Rolland Bimler D on: 11/01/2021 05:04 PM   Modules accepted: Orders

## 2021-11-08 ENCOUNTER — Ambulatory Visit (INDEPENDENT_AMBULATORY_CARE_PROVIDER_SITE_OTHER): Payer: BC Managed Care – PPO

## 2021-11-08 DIAGNOSIS — J309 Allergic rhinitis, unspecified: Secondary | ICD-10-CM | POA: Diagnosis not present

## 2021-11-15 ENCOUNTER — Ambulatory Visit (INDEPENDENT_AMBULATORY_CARE_PROVIDER_SITE_OTHER): Payer: BC Managed Care – PPO

## 2021-11-15 DIAGNOSIS — J309 Allergic rhinitis, unspecified: Secondary | ICD-10-CM | POA: Diagnosis not present

## 2021-11-22 ENCOUNTER — Ambulatory Visit (INDEPENDENT_AMBULATORY_CARE_PROVIDER_SITE_OTHER): Payer: BC Managed Care – PPO | Admitting: *Deleted

## 2021-11-22 DIAGNOSIS — J309 Allergic rhinitis, unspecified: Secondary | ICD-10-CM | POA: Diagnosis not present

## 2021-12-05 ENCOUNTER — Ambulatory Visit (INDEPENDENT_AMBULATORY_CARE_PROVIDER_SITE_OTHER): Payer: BC Managed Care – PPO

## 2021-12-05 DIAGNOSIS — J309 Allergic rhinitis, unspecified: Secondary | ICD-10-CM | POA: Diagnosis not present

## 2021-12-13 ENCOUNTER — Ambulatory Visit (INDEPENDENT_AMBULATORY_CARE_PROVIDER_SITE_OTHER): Payer: BC Managed Care – PPO

## 2021-12-13 DIAGNOSIS — J309 Allergic rhinitis, unspecified: Secondary | ICD-10-CM

## 2021-12-23 ENCOUNTER — Ambulatory Visit (INDEPENDENT_AMBULATORY_CARE_PROVIDER_SITE_OTHER): Payer: BC Managed Care – PPO

## 2021-12-23 ENCOUNTER — Other Ambulatory Visit: Payer: Self-pay

## 2021-12-23 DIAGNOSIS — J309 Allergic rhinitis, unspecified: Secondary | ICD-10-CM | POA: Diagnosis not present

## 2021-12-23 NOTE — Telephone Encounter (Signed)
error 

## 2021-12-31 ENCOUNTER — Ambulatory Visit (INDEPENDENT_AMBULATORY_CARE_PROVIDER_SITE_OTHER): Payer: BC Managed Care – PPO

## 2021-12-31 DIAGNOSIS — J309 Allergic rhinitis, unspecified: Secondary | ICD-10-CM | POA: Diagnosis not present

## 2022-01-09 ENCOUNTER — Ambulatory Visit (INDEPENDENT_AMBULATORY_CARE_PROVIDER_SITE_OTHER): Payer: BC Managed Care – PPO

## 2022-01-09 DIAGNOSIS — J309 Allergic rhinitis, unspecified: Secondary | ICD-10-CM

## 2022-01-16 ENCOUNTER — Ambulatory Visit (INDEPENDENT_AMBULATORY_CARE_PROVIDER_SITE_OTHER): Payer: BC Managed Care – PPO

## 2022-01-16 DIAGNOSIS — J309 Allergic rhinitis, unspecified: Secondary | ICD-10-CM

## 2022-01-28 ENCOUNTER — Ambulatory Visit (INDEPENDENT_AMBULATORY_CARE_PROVIDER_SITE_OTHER): Payer: BC Managed Care – PPO | Admitting: *Deleted

## 2022-01-28 DIAGNOSIS — J309 Allergic rhinitis, unspecified: Secondary | ICD-10-CM

## 2022-02-14 ENCOUNTER — Ambulatory Visit (INDEPENDENT_AMBULATORY_CARE_PROVIDER_SITE_OTHER): Payer: BC Managed Care – PPO

## 2022-02-14 DIAGNOSIS — J309 Allergic rhinitis, unspecified: Secondary | ICD-10-CM

## 2022-03-06 ENCOUNTER — Ambulatory Visit (INDEPENDENT_AMBULATORY_CARE_PROVIDER_SITE_OTHER): Payer: BC Managed Care – PPO

## 2022-03-06 DIAGNOSIS — J309 Allergic rhinitis, unspecified: Secondary | ICD-10-CM | POA: Diagnosis not present

## 2022-03-13 ENCOUNTER — Ambulatory Visit (INDEPENDENT_AMBULATORY_CARE_PROVIDER_SITE_OTHER): Payer: BC Managed Care – PPO

## 2022-03-13 DIAGNOSIS — J309 Allergic rhinitis, unspecified: Secondary | ICD-10-CM | POA: Diagnosis not present

## 2022-05-23 ENCOUNTER — Ambulatory Visit: Payer: Self-pay

## 2022-05-26 ENCOUNTER — Telehealth: Payer: Self-pay

## 2022-05-26 ENCOUNTER — Ambulatory Visit (INDEPENDENT_AMBULATORY_CARE_PROVIDER_SITE_OTHER): Payer: BC Managed Care – PPO

## 2022-05-26 DIAGNOSIS — J309 Allergic rhinitis, unspecified: Secondary | ICD-10-CM | POA: Diagnosis not present

## 2022-05-26 NOTE — Telephone Encounter (Signed)
Patient came in for shot on Friday. Her last injection was on 03/13/2022. She received 0.1 ml of Tyrone Schimke. I did tell patient I had to ask provider about where to start her off.  It was very busy and she could not wait. She states she would come in sometime this week.    Please advice where to start patient. Does she need to be backed down to gold?

## 2022-06-10 ENCOUNTER — Ambulatory Visit (INDEPENDENT_AMBULATORY_CARE_PROVIDER_SITE_OTHER): Payer: BC Managed Care – PPO

## 2022-06-10 DIAGNOSIS — J309 Allergic rhinitis, unspecified: Secondary | ICD-10-CM | POA: Diagnosis not present

## 2022-06-20 ENCOUNTER — Ambulatory Visit (INDEPENDENT_AMBULATORY_CARE_PROVIDER_SITE_OTHER): Payer: BC Managed Care – PPO

## 2022-06-20 DIAGNOSIS — J309 Allergic rhinitis, unspecified: Secondary | ICD-10-CM

## 2022-07-04 ENCOUNTER — Ambulatory Visit (INDEPENDENT_AMBULATORY_CARE_PROVIDER_SITE_OTHER): Payer: BC Managed Care – PPO

## 2022-07-04 DIAGNOSIS — J309 Allergic rhinitis, unspecified: Secondary | ICD-10-CM

## 2022-07-25 ENCOUNTER — Ambulatory Visit (INDEPENDENT_AMBULATORY_CARE_PROVIDER_SITE_OTHER): Payer: BC Managed Care – PPO | Admitting: *Deleted

## 2022-07-25 DIAGNOSIS — J309 Allergic rhinitis, unspecified: Secondary | ICD-10-CM | POA: Diagnosis not present

## 2022-08-27 ENCOUNTER — Ambulatory Visit (INDEPENDENT_AMBULATORY_CARE_PROVIDER_SITE_OTHER): Payer: BC Managed Care – PPO

## 2022-08-27 DIAGNOSIS — J309 Allergic rhinitis, unspecified: Secondary | ICD-10-CM

## 2022-09-02 DIAGNOSIS — J3089 Other allergic rhinitis: Secondary | ICD-10-CM | POA: Diagnosis not present

## 2022-09-02 NOTE — Progress Notes (Signed)
VIALS EXP 09-02-23

## 2022-09-09 ENCOUNTER — Ambulatory Visit (INDEPENDENT_AMBULATORY_CARE_PROVIDER_SITE_OTHER): Payer: BC Managed Care – PPO

## 2022-09-09 DIAGNOSIS — J309 Allergic rhinitis, unspecified: Secondary | ICD-10-CM

## 2022-09-24 ENCOUNTER — Ambulatory Visit (INDEPENDENT_AMBULATORY_CARE_PROVIDER_SITE_OTHER): Payer: BC Managed Care – PPO

## 2022-09-24 DIAGNOSIS — J309 Allergic rhinitis, unspecified: Secondary | ICD-10-CM | POA: Diagnosis not present

## 2022-11-10 ENCOUNTER — Other Ambulatory Visit: Payer: Self-pay

## 2022-11-10 ENCOUNTER — Other Ambulatory Visit: Payer: Self-pay | Admitting: Allergy

## 2022-11-10 MED ORDER — PANTOPRAZOLE SODIUM 40 MG PO TBEC: 40.0000 mg | DELAYED_RELEASE_TABLET | Freq: Every day | ORAL | 0 refills | Status: AC

## 2022-11-20 ENCOUNTER — Other Ambulatory Visit: Payer: Self-pay

## 2022-11-20 ENCOUNTER — Ambulatory Visit (INDEPENDENT_AMBULATORY_CARE_PROVIDER_SITE_OTHER): Payer: BC Managed Care – PPO | Admitting: Allergy

## 2022-11-20 ENCOUNTER — Encounter: Payer: Self-pay | Admitting: Allergy

## 2022-11-20 VITALS — BP 120/60 | HR 103 | Temp 98.6°F | Ht 66.73 in | Wt 169.9 lb

## 2022-11-20 DIAGNOSIS — H1013 Acute atopic conjunctivitis, bilateral: Secondary | ICD-10-CM

## 2022-11-20 DIAGNOSIS — J4520 Mild intermittent asthma, uncomplicated: Secondary | ICD-10-CM | POA: Diagnosis not present

## 2022-11-20 DIAGNOSIS — J3089 Other allergic rhinitis: Secondary | ICD-10-CM | POA: Diagnosis not present

## 2022-11-20 DIAGNOSIS — K219 Gastro-esophageal reflux disease without esophagitis: Secondary | ICD-10-CM

## 2022-11-20 DIAGNOSIS — T781XXD Other adverse food reactions, not elsewhere classified, subsequent encounter: Secondary | ICD-10-CM

## 2022-11-20 MED ORDER — FLUTICASONE PROPIONATE 50 MCG/ACT NA SUSP: NASAL | 11 refills | Status: AC

## 2022-11-20 MED ORDER — PANTOPRAZOLE SODIUM 40 MG PO TBEC: 40.0000 mg | DELAYED_RELEASE_TABLET | Freq: Every day | ORAL | 11 refills | Status: AC

## 2022-11-20 MED ORDER — ALBUTEROL SULFATE HFA 108 (90 BASE) MCG/ACT IN AERS: 2.0000 | INHALATION_SPRAY | Freq: Four times a day (QID) | RESPIRATORY_TRACT | 2 refills | Status: AC | PRN

## 2022-11-20 MED ORDER — CETIRIZINE HCL 10 MG PO TABS: 10.0000 mg | ORAL_TABLET | Freq: Every day | ORAL | 11 refills | Status: AC | PRN

## 2022-11-20 NOTE — Patient Instructions (Signed)
Mild intermittent asthma Controlled at this time Lung function testing looks great -Inhalers: Rescue: ProAir HFA puffs every 4 hours as needed for cough or wheeze.       -Use 2 puffs 10-20 minutes prior to exercise.   Asthma control goals:  Full participation in all desired activities (may need albuterol before activity) Albuterol use two time or less a week on average (not counting use with activity) Cough interfering with sleep two time or less a month Oral steroids no more than once a year No hospitalizations   Allergic rhinoconjunctivitis - Continue avoidance measures for dust mites, mold and pollen - Antihistamine: Zyrtec 10mg  by mouth once daily as needed for allergy symptom control.   - Nasal Spray: Saline 2 spray(s) each nostril at bath time to keep nose/sinuses flushed and clean    If needed for nasal congestion Flonase 1-2 sprays each nostril daily.  Use 1-2 weeks at a time before stopping once symptoms improve.   - When ready to resume allergy shots when you can be consistent in getting them then you can restart at that time.  Call and let us know when and if you would like to restart.     Pollen food allergy syndrome  - Oral symptoms with banana ingestion in the past but is able to eat now without issue  Reflux -Continue Protonix 40 mg daily for control   Follow up Visit: 12 months or sooner if needed.

## 2022-11-20 NOTE — Progress Notes (Signed)
Follow-up Note  RE: Jasmine Aguilar MRN: [id number] DOB: Feb 14, 2001 Date of Office Visit: 11/20/2022   History of present illness: Jasmine Aguilar is a 22 y.o. female presenting today for follow-up of asthma, allergic rhinitis with conjunctivitis, oral allergy syndrome and GERD.  She was last seen in the office on 11/01/21 by myself.  She has not had any major health changes, surgeries or hospitalizations.   She states she ran out of her protonix and she started having reflux symptoms.  She likes to eat spicy foods and without the protonix she was noticing heartburn.   She states her asthma has been under control with no flares.  Denies daytime and nighttime symptoms. No ED/UC visits or systemic steroid needs in past year.  She denies albuterol needs in the past year.  She states her allergies have been ok thus far this year.  She is not report nasal, ocular or generalized symptoms.  She will use zyrtec as needed and states has not needed to use lately.  She has flonase to use as needed as well but has not had the need.   She was on allergy shots but states it become hard  Review of systems: Review of Systems  Constitutional: Negative.   HENT: Negative.    Eyes: Negative.   Respiratory: Negative.    Cardiovascular: Negative.   Gastrointestinal:        See HPI  Musculoskeletal: Negative.   Skin: Negative.   Allergic/Immunologic: Negative.   Neurological: Negative.      All other systems negative unless noted above in HPI  Past medical/social/surgical/family history have been reviewed and are unchanged unless specifically indicated below.  No changes  Medication List: Current Outpatient Medications  Medication Sig Dispense Refill   EPINEPHrine (EPIPEN 2-PAK) 0.3 mg/0.3 mL IJ SOAJ injection Inject 0.3 mg into the muscle as needed for anaphylaxis. 2 each 1   famotidine (PEPCID) 20 MG tablet Take 1 tablet (20 mg total) by mouth daily. 30 tablet 3   Ferrous Sulfate Dried (FERROUS  SULFATE IRON PO) Take by mouth.     fluticasone (FLONASE) 50 MCG/ACT nasal spray Place 2 sprays into both nostrils daily. 48 g 1   fluticasone (FLONASE) 50 MCG/ACT nasal spray 1-2 sprays each nostril daily. 16 g 11   lisdexamfetamine (VYVANSE) 30 MG capsule Take by mouth.     Multiple Vitamin tablet Take 1 tablet by mouth daily.     norethindrone-ethinyl estradiol (LOESTRIN) 1-20 MG-MCG tablet TK 1 T PO QD     pantoprazole (PROTONIX) 40 MG tablet Take 1 tablet (40 mg total) by mouth daily. 30 tablet 0   pantoprazole (PROTONIX) 40 MG tablet Take 1 tablet (40 mg total) by mouth daily. 30 tablet 11   Vitamin D, Ergocalciferol, (DRISDOL) 1.25 MG (50000 UNIT) CAPS capsule Take 50,000 Units by mouth 2 (two) times a week.     albuterol (VENTOLIN HFA) 108 (90 Base) MCG/ACT inhaler Inhale 2 puffs into the lungs every 6 (six) hours as needed for wheezing or shortness of breath. 18 g 2   cetirizine (ZYRTEC) 10 MG tablet Take 1 tablet (10 mg total) by mouth daily as needed for allergies. 30 tablet 11   No current facility-administered medications for this visit.     Known medication allergies: No Known Allergies   Physical examination: Blood pressure 120/60, pulse (!) 103, temperature 98.6 F (37 C), height 5' 6.73" (1.695 m), weight 169 lb 14.4 oz (77.1 kg), SpO2 100 %.  General: Alert, interactive, in no acute distress. HEENT: PERRLA, TMs pearly gray, turbinates non-edematous without discharge, post-pharynx non erythematous. Neck: Supple without lymphadenopathy. Lungs: Clear to auscultation without wheezing, rhonchi or rales. {no increased work of breathing. CV: Normal S1, S2 without murmurs. Abdomen: Nondistended, nontender. Skin: Warm and dry, without lesions or rashes. Extremities:  No clubbing, cyanosis or edema. Neuro:   Grossly intact.  Diagnositics/Labs:  Spirometry: FEV1: 2.77L 915, FVC: 3.79l 1085, ratio consistent with nonobstructive pattern   Assessment and plan:   Mild  intermittent asthma Controlled at this time Lung function testing looks great -Inhalers: Rescue: ProAir HFA puffs every 4 hours as needed for cough or wheeze.       -Use 2 puffs 10-20 minutes prior to exercise.   Asthma control goals:  Full participation in all desired activities (may need albuterol before activity) Albuterol use two time or less a week on average (not counting use with activity) Cough interfering with sleep two time or less a month Oral steroids no more than once a year No hospitalizations   Allergic rhinoconjunctivitis - Continue avoidance measures for dust mites, mold and pollen - Antihistamine: Zyrtec 10mg  by mouth once daily as needed for allergy symptom control.   - Nasal Spray: Saline 2 spray(s) each nostril at bath time to keep nose/sinuses flushed and clean    If needed for nasal congestion Flonase 1-2 sprays each nostril daily.  Use 1-2 weeks at a time before stopping once symptoms improve.   - When ready to resume allergy shots when you can be consistent in getting them then you can restart at that time.  Call and let us know when and if you would like to restart.     Pollen food allergy syndrome  - Oral symptoms with banana ingestion in the past but is able to eat now without issue  Reflux -Continue Protonix 40 mg daily for control   Follow up Visit: 12 months or sooner if needed.    I appreciate the opportunity to take part in Archita's care. Please do not hesitate to contact me with questions.  Sincerely,   Margo Aye, MD Allergy/Immunology [facility]
